# Patient Record
Sex: Female | Born: 1970 | Hispanic: Yes | Marital: Married | Smoking: Never smoker
Health system: Southern US, Community
[De-identification: ages and names within clinical notes are randomized; demographics above are authoritative.]

---

## 2017-01-07 LAB — GLUCOSE, POCT (MANUAL RESULT ENTRY): POC Glucose: 86 mg/dl (ref 70–99)

## 2017-10-11 ENCOUNTER — Ambulatory Visit (INDEPENDENT_AMBULATORY_CARE_PROVIDER_SITE_OTHER): Payer: Self-pay

## 2017-10-11 ENCOUNTER — Encounter (HOSPITAL_COMMUNITY): Payer: Self-pay | Admitting: Emergency Medicine

## 2017-10-11 ENCOUNTER — Ambulatory Visit (HOSPITAL_COMMUNITY)
Admission: EM | Admit: 2017-10-11 | Discharge: 2017-10-11 | Disposition: A | Discharge status: Home / Self Care | Payer: Self-pay | Attending: Family Medicine | Admitting: Family Medicine

## 2017-10-11 DIAGNOSIS — R002 Palpitations: Secondary | ICD-10-CM

## 2017-10-11 DIAGNOSIS — R05 Cough: Secondary | ICD-10-CM

## 2017-10-11 DIAGNOSIS — J209 Acute bronchitis, unspecified: Secondary | ICD-10-CM

## 2017-10-11 DIAGNOSIS — R300 Dysuria: Secondary | ICD-10-CM

## 2017-10-11 LAB — POCT URINALYSIS DIP (DEVICE)
BILIRUBIN URINE: NEGATIVE
GLUCOSE, UA: NEGATIVE mg/dL
Ketones, ur: NEGATIVE mg/dL
LEUKOCYTES UA: NEGATIVE
Nitrite: NEGATIVE
Protein, ur: NEGATIVE mg/dL
Specific Gravity, Urine: >=1.03 (ref 1.005–1.030)
UROBILINOGEN UA: 0.2 mg/dL (ref 0.0–1.0)
pH: 6 (ref 5.0–8.0)

## 2017-10-11 MED ORDER — OMEPRAZOLE 20 MG PO CPDR: 20.0000 mg | DELAYED_RELEASE_CAPSULE | Freq: Every day | ORAL | 1 refills | Status: DC

## 2017-10-11 MED ORDER — PREDNISONE 20 MG PO TABS: ORAL_TABLET | ORAL | 0 refills | Status: DC

## 2017-10-11 NOTE — ED Provider Notes (Addendum)
[facility]   [id number] 10/11/17 Arrival Time: 1015   SUBJECTIVE:  Tricia Carney is a 46 y.o. female who presents to the urgent care with complaint of two weeks of constant CP associated w/palpitations, left arm numbness, chills  Also c/o UTI sx   History reviewed. No pertinent past medical history. History reviewed. No pertinent family history. Social History   Socioeconomic History  . Marital status: Married    Spouse name: Not on file  . Number of children: Not on file  . Years of education: Not on file  . Highest education level: Not on file  Social Needs  . Financial resource strain: Not on file  . Food insecurity - worry: Not on file  . Food insecurity - inability: Not on file  . Transportation needs - medical: Not on file  . Transportation needs - non-medical: Not on file  Occupational History  . Not on file  Tobacco Use  . Smoking status: Never Smoker  . Smokeless tobacco: Never Used  Substance and Sexual Activity  . Alcohol use: Not on file  . Drug use: Not on file  . Sexual activity: Not on file  Other Topics Concern  . Not on file  Social History Narrative  . Not on file   No outpatient medications have been marked as taking for the 10/11/17 encounter [facility](Hospital Encounter).   No Known Allergies    ROS: As per HPI, remainder of ROS negative.   OBJECTIVE:   Vitals:   10/11/17 1138  BP: (!) 141/92  Pulse: 85  Resp: 16  Temp: 98.6 F (37 C)  TempSrc: Oral  SpO2: 95%     General appearance: alert; no distress Eyes: PERRL; EOMI; conjunctiva normal HENT: normocephalic; atraumatic; TMs normal, canal normal, external ears normal without trauma; nasal mucosa normal; oral mucosa normal Neck: supple Lungs: clear to auscultation bilaterally Heart: regular rate and rhythm Abdomen: soft, non-tender; bowel sounds normal; no masses or organomegaly; no guarding or rebound tenderness Back: no CVA tenderness Extremities: no  cyanosis or edema; symmetrical with no gross deformities Skin: warm and dry Neurologic:  grossly normal Psychological: alert and cooperative; normal mood and affect      Labs:  Results for orders placed or performed during the hospital encounter of 10/11/17  POCT urinalysis dip (device)  Result Value Ref Range   Glucose, UA NEGATIVE NEGATIVE mg/dL   Bilirubin Urine NEGATIVE NEGATIVE   Ketones, ur NEGATIVE NEGATIVE mg/dL   Specific Gravity, Urine >=1.030 1.005 - 1.030   Hgb urine dipstick SMALL (A) NEGATIVE   pH 6.0 5.0 - 8.0   Protein, ur NEGATIVE NEGATIVE mg/dL   Urobilinogen, UA 0.2 0.0 - 1.0 mg/dL   Nitrite NEGATIVE NEGATIVE   Leukocytes, UA NEGATIVE NEGATIVE    Labs Reviewed  POCT URINALYSIS DIP (DEVICE) - Abnormal; Notable for the following components:      Result Value   Hgb urine dipstick SMALL (*)    All other components within normal limits    EKG shows no acute changes; Q wave in III.    ASSESSMENT & PLAN:  1. Acute bronchitis, unspecified organism     Meds ordered this encounter  Medications  . predniSONE (DELTASONE) 20 MG tablet    Sig: Two daily with food    Dispense:  10 tablet    Refill:  0  . omeprazole (PRILOSEC) 20 MG capsule    Sig: Take 1 capsule (20 mg total) by mouth daily.    Dispense:  14 capsule    Refill:  1    Reviewed expectations re: course of current medical issues. Questions answered. Outlined signs and symptoms indicating need for more acute intervention. Patient verbalized understanding. After Visit Summary given.      Elvina SidleLauenstein, Devynne Sturdivant, MD 10/11/17 1150    Elvina SidleLauenstein, Rylah Fukuda, MD 10/11/17 1223

## 2017-10-11 NOTE — ED Triage Notes (Signed)
PT C/O: constant CP associated w/palpitations, left arm numbness, chills  Also c/o UTI sx  ONSET: 15 days   DENIES: fevers  TAKING MEDS: acetaminophen   A&O x4... NAD... Ambulatory .Marland Kitchen.Marland Kitchen.. 46 y/o son at bedside.

## 2018-05-23 ENCOUNTER — Encounter (HOSPITAL_COMMUNITY): Payer: Self-pay | Admitting: Emergency Medicine

## 2018-05-23 ENCOUNTER — Ambulatory Visit (HOSPITAL_COMMUNITY)
Admission: EM | Admit: 2018-05-23 | Discharge: 2018-05-23 | Disposition: A | Discharge status: Home / Self Care | Payer: Self-pay | Attending: Family Medicine | Admitting: Family Medicine

## 2018-05-23 ENCOUNTER — Other Ambulatory Visit: Payer: Self-pay

## 2018-05-23 DIAGNOSIS — L84 Corns and callosities: Secondary | ICD-10-CM

## 2018-05-23 MED ORDER — CORN CUSHIONS PADS: 1.0000 "application " | MEDICATED_PAD | Freq: Every day | 0 refills | Status: DC

## 2018-05-23 NOTE — Discharge Instructions (Addendum)
Your symptoms may be due to ill-fitting shoes.  Ensure your shoes fit properly and are not too tight.   Avoid tight shoes or shoes with a heel Avoid going barefoot or wearing shoes without socks Corn cushions prescribed.  Apply to corns daily to help alleviate symptoms.   Primary Care Assistance was initiated to help set you up with a PCP in the area Follow up with PCP or [facility] for further evaluation and management.  They may need to refer you to a foot specialist Return or go to the ED if you have any new or worsening symptoms  Sus sntomas pueden deberse a zapatos que no le [address] bien. Asegrese de que sus zapatos le queden bien y que no estn demasiado apretados. Evite zapatos apretados o zapatos con tacn Evite ir descalzo o usar zapatos sin medias [place] de maz prescritos. Aplicar a los callos diariamente para ayudar a [employer] Groupaliviar los sntomas. La Asistencia de atencin primaria se inici para ayudarlo a Ship brokerestablecer un PCP en el rea Haga un seguimiento con PCP o con [facility] para una evaluacin y gestin adicionales. Es posible que necesiten derivarlo a un especialista en pies. Regrese o vaya al servicio de urgencias si tiene algn sntoma nuevo o que Stantonempeora

## 2018-05-23 NOTE — ED Provider Notes (Signed)
[facility]   [id number] 05/23/18 Arrival Time: 1215  CC: TOE PAIN  SUBJECTIVE:  InterpretorHelmut Muster: Alicia [phone]  History from: patient. Tricia Carney is a 47 y.o. female complains of worsening "calluses" for the past month.  Denies a precipitating event or specific injury, does not attribute her symptoms to her shoes.  Localizes the symptoms to the bilateral pinky toes  Describes the pain as constant burning, and throbbing in character.  Has not tried OTC medications or treatment.  Denies aggravating factors. Denies similar symptoms in the past.  Denies fever, chills, erythema, ecchymosis, effusion, weakness, numbness and tingling.      ROS: As per HPI.  History reviewed. No pertinent past medical history. History reviewed. No pertinent surgical history. No Known Allergies No current facility-administered medications on file prior to encounter.    No current outpatient medications on file prior to encounter.   Social History   Socioeconomic History  . Marital status: Married    Spouse name: Not on file  . Number of children: Not on file  . Years of education: Not on file  . Highest education level: Not on file  Occupational History  . Not on file  Social Needs  . Financial resource strain: Not on file  . Food insecurity:    Worry: Not on file    Inability: Not on file  . Transportation needs:    Medical: Not on file    Non-medical: Not on file  Tobacco Use  . Smoking status: Never Smoker  . Smokeless tobacco: Never Used  Substance and Sexual Activity  . Alcohol use: Not on file  . Drug use: Not on file  . Sexual activity: Not on file  Lifestyle  . Physical activity:    Days per week: Not on file    Minutes per session: Not on file  . Stress: Not on file  Relationships  . Social connections:    Talks on phone: Not on file    Gets together: Not on file    Attends religious service: Not on file    Active member of club or organization: Not on file      Attends meetings of clubs or organizations: Not on file    Relationship status: Not on file  . Intimate partner violence:    Fear of current or ex partner: Not on file    Emotionally abused: Not on file    Physically abused: Not on file    Forced sexual activity: Not on file  Other Topics Concern  . Not on file  Social History Narrative  . Not on file   History reviewed. No pertinent family history.  OBJECTIVE:  Vitals:   05/23/18 1236  BP: (!) 146/90  Pulse: 82  TempSrc: Oral  SpO2: 98%    General appearance: AOx3; in no acute distress.  Head: NCAT Lungs: speaking in full sentences without difficulty CV: dorsalis pedis pulses 2+ bilaterally; cap refill <2 secs about the toes Musculoskeletal: Bilateral feet Inspection: Skin warm, dry, clear and intact without obvious erythema, effusion, or ecchymosis. Hyperkeratosis to the lateral aspect of bilateral 5th toes.   Palpation: Nontender to palpation ROM: FROM active and passive Strength: 5/5 dorsiflexion, 5/5 plantar flexion Skin: warm and dry Neurologic: Ambulates without difficulty; Sensation intact about the lower extremities Psychological: alert and cooperative; normal mood and affect  ASSESSMENT & PLAN:  1. Corns of multiple toes    Meds ordered this encounter  Medications  . Foot Care Products (CORN  CUSHIONS) PADS    Sig: 1 application by Does not apply route daily.    Dispense:  9 each    Refill:  0    Order Specific Question:   Supervising Provider    Answer:   Isa Rankin [[id number]]   Your symptoms may be due to ill-fitting shoes.  Ensure your shoes fit properly and are not too tight.   Avoid tight shoes or shoes with a heel Avoid going barefoot or wearing shoes without socks Corn cushions prescribed.  Apply to corns daily to help alleviate symptoms.   Primary Care Assistance was initiated to help set you up with a PCP in the area Follow up with PCP or with [facility] for  further evaluation and management.  They may need to refer you to a foot specialist Return or go to the ED if you have any new or worsening symptoms  Reviewed expectations re: course of current medical issues. Questions answered. Outlined signs and symptoms indicating need for more acute intervention. Patient verbalized understanding. After Visit Summary given.    Rennis Harding, PA-C 05/23/18 1335

## 2018-05-23 NOTE — ED Triage Notes (Signed)
[employer] Interpreter: Tricia Carney [phone]#[id number]  Pt complains of pain in her pinky toes bilaterally x one month.

## 2018-11-15 ENCOUNTER — Encounter (HOSPITAL_COMMUNITY): Payer: Self-pay | Admitting: Emergency Medicine

## 2018-11-15 ENCOUNTER — Ambulatory Visit (HOSPITAL_COMMUNITY)
Admission: EM | Admit: 2018-11-15 | Discharge: 2018-11-15 | Disposition: A | Discharge status: Home / Self Care | Payer: Self-pay | Attending: Family Medicine | Admitting: Family Medicine

## 2018-11-15 ENCOUNTER — Other Ambulatory Visit: Payer: Self-pay

## 2018-11-15 DIAGNOSIS — K047 Periapical abscess without sinus: Secondary | ICD-10-CM

## 2018-11-15 DIAGNOSIS — J069 Acute upper respiratory infection, unspecified: Secondary | ICD-10-CM

## 2018-11-15 MED ORDER — IBUPROFEN 800 MG PO TABS: 800.0000 mg | ORAL_TABLET | Freq: Three times a day (TID) | ORAL | 0 refills | Status: DC

## 2018-11-15 MED ORDER — HYDROCODONE-HOMATROPINE 5-1.5 MG/5ML PO SYRP: 5.0000 mL | ORAL_SOLUTION | Freq: Four times a day (QID) | ORAL | 0 refills | Status: DC | PRN

## 2018-11-15 MED ORDER — AMOXICILLIN 875 MG PO TABS: 875.0000 mg | ORAL_TABLET | Freq: Two times a day (BID) | ORAL | 0 refills | Status: DC

## 2018-11-15 NOTE — ED Triage Notes (Addendum)
Chills, headache, stomach pain and sore throat/itchy throat and back pain and cough.  Onset on Monday of symptoms.

## 2018-11-15 NOTE — ED Provider Notes (Signed)
[facility]    CSN: [id number] Arrival date & time: 11/15/18  1425     History   Chief Complaint Chief Complaint  Patient presents with  . URI    HPI [place] Selenna Hommel is a 48 y.o. female.   Is a 47 year old female who presents to the [facility] urgent care center complaining of flulike symptoms.  Chills, headache, stomach pain and sore throat/itchy throat and back pain and cough.  Onset on Monday of symptoms.  Patient notes she has a bad tooth and is having a dental evaluation on February 5     History reviewed. No pertinent past medical history.  There are no active problems to display for this patient.   History reviewed. No pertinent surgical history.  OB History   No obstetric history on file.      Home Medications    Prior to Admission medications   Medication Sig Start Date End Date Taking? Authorizing Provider  amoxicillin (AMOXIL) 875 MG tablet Take 1 tablet (875 mg total) by mouth 2 (two) times daily. 11/15/18   Elvina Sidle, MD  Foot Care Products (CORN CUSHIONS) PADS 1 application by Does not apply route daily. 05/23/18   Wurst, [place], PA-C  HYDROcodone-homatropine (HYDROMET) 5-1.5 MG/5ML syrup Take 5 mLs by mouth every 6 (six) hours as needed for cough. 11/15/18   Elvina Sidle, MD  ibuprofen (ADVIL,MOTRIN) 800 MG tablet Take 1 tablet (800 mg total) by mouth 3 (three) times daily. 11/15/18   Elvina Sidle, MD    Family History Family History  Problem Relation Age of Onset  . Hypertension Mother     Social History Social History   Tobacco Use  . Smoking status: Never Smoker  . Smokeless tobacco: Never Used  Substance Use Topics  . Alcohol use: Never    Frequency: Never  . Drug use: Never     Allergies   Patient has no known allergies.   Review of Systems Review of Systems   Physical Exam Triage Vital Signs ED Triage Vitals  Enc Vitals Group     BP 11/15/18 1528 (!) 119/54   Pulse Rate 11/15/18 1527 73     Resp 11/15/18 1527 18     Temp 11/15/18 1527 98.5 F (36.9 C)     Temp Source 11/15/18 1527 Temporal     SpO2 11/15/18 1527 97 %     Weight --      Height --      Head Circumference --      Peak Flow --      Pain Score 11/15/18 1522 8     Pain Loc --      Pain Edu? --      Excl. in [place]? --    No data found.  Updated Vital Signs BP (!) 119/54 (BP Location: Right Arm)   Pulse 73   Temp 98.5 F (36.9 C) (Temporal)   Resp 18   SpO2 97%    Physical Exam Vitals signs and nursing note reviewed.  Constitutional:      Appearance: Normal appearance. She is obese.  HENT:     Head: Normocephalic and atraumatic.     Right Ear: Tympanic membrane and external ear normal.     Left Ear: Tympanic membrane and external ear normal.     Nose: Congestion present.     Mouth/Throat:     Mouth: Mucous membranes are moist.     Comments: Very eroded tooth #13 Eyes:  Conjunctiva/sclera: Conjunctivae normal.  Neck:     Musculoskeletal: Normal range of motion and neck supple.  Cardiovascular:     Rate and Rhythm: Normal rate and regular rhythm.     Heart sounds: Normal heart sounds.  Pulmonary:     Effort: Pulmonary effort is normal.     Breath sounds: Normal breath sounds.  Musculoskeletal: Normal range of motion.  Skin:    General: Skin is warm and dry.  Neurological:     General: No focal deficit present.     Mental Status: She is alert.  Psychiatric:        Mood and Affect: Mood normal.      UC Treatments / Results  Labs (all labs ordered are listed, but only abnormal results are displayed) Labs Reviewed - No data to display  EKG None  Radiology No results found.  Procedures Procedures (including critical care time)  Medications Ordered in UC Medications - No data to display  Initial Impression / Assessment and Plan / UC Course  I have reviewed the triage vital signs and the nursing notes.  Pertinent labs & imaging results that  were available during my care of the patient were reviewed by me and considered in my medical decision making (see chart for details).    Final Clinical Impressions(s) / UC Diagnoses   Final diagnoses:  Dental abscess  URI with cough and congestion   Discharge Instructions   None    ED Prescriptions    Medication Sig Dispense Auth. Provider   amoxicillin (AMOXIL) 875 MG tablet Take 1 tablet (875 mg total) by mouth 2 (two) times daily. 20 tablet Elvina Sidle, MD   HYDROcodone-homatropine (HYDROMET) 5-1.5 MG/5ML syrup Take 5 mLs by mouth every 6 (six) hours as needed for cough. 60 mL Elvina Sidle, MD   ibuprofen (ADVIL,MOTRIN) 800 MG tablet Take 1 tablet (800 mg total) by mouth 3 (three) times daily. 21 tablet Elvina Sidle, MD     Controlled Substance Prescriptions [place] Controlled Substance Registry consulted? Not Applicable   Elvina Sidle, MD 11/15/18 1600

## 2018-12-15 IMAGING — DX DG CHEST 2V
2 series · 2 of 2 positions shown · non-contrast
Comparison: None.

CLINICAL DATA: Cough for 2 weeks

EXAM:
CHEST  2 VIEW

[chest pa]
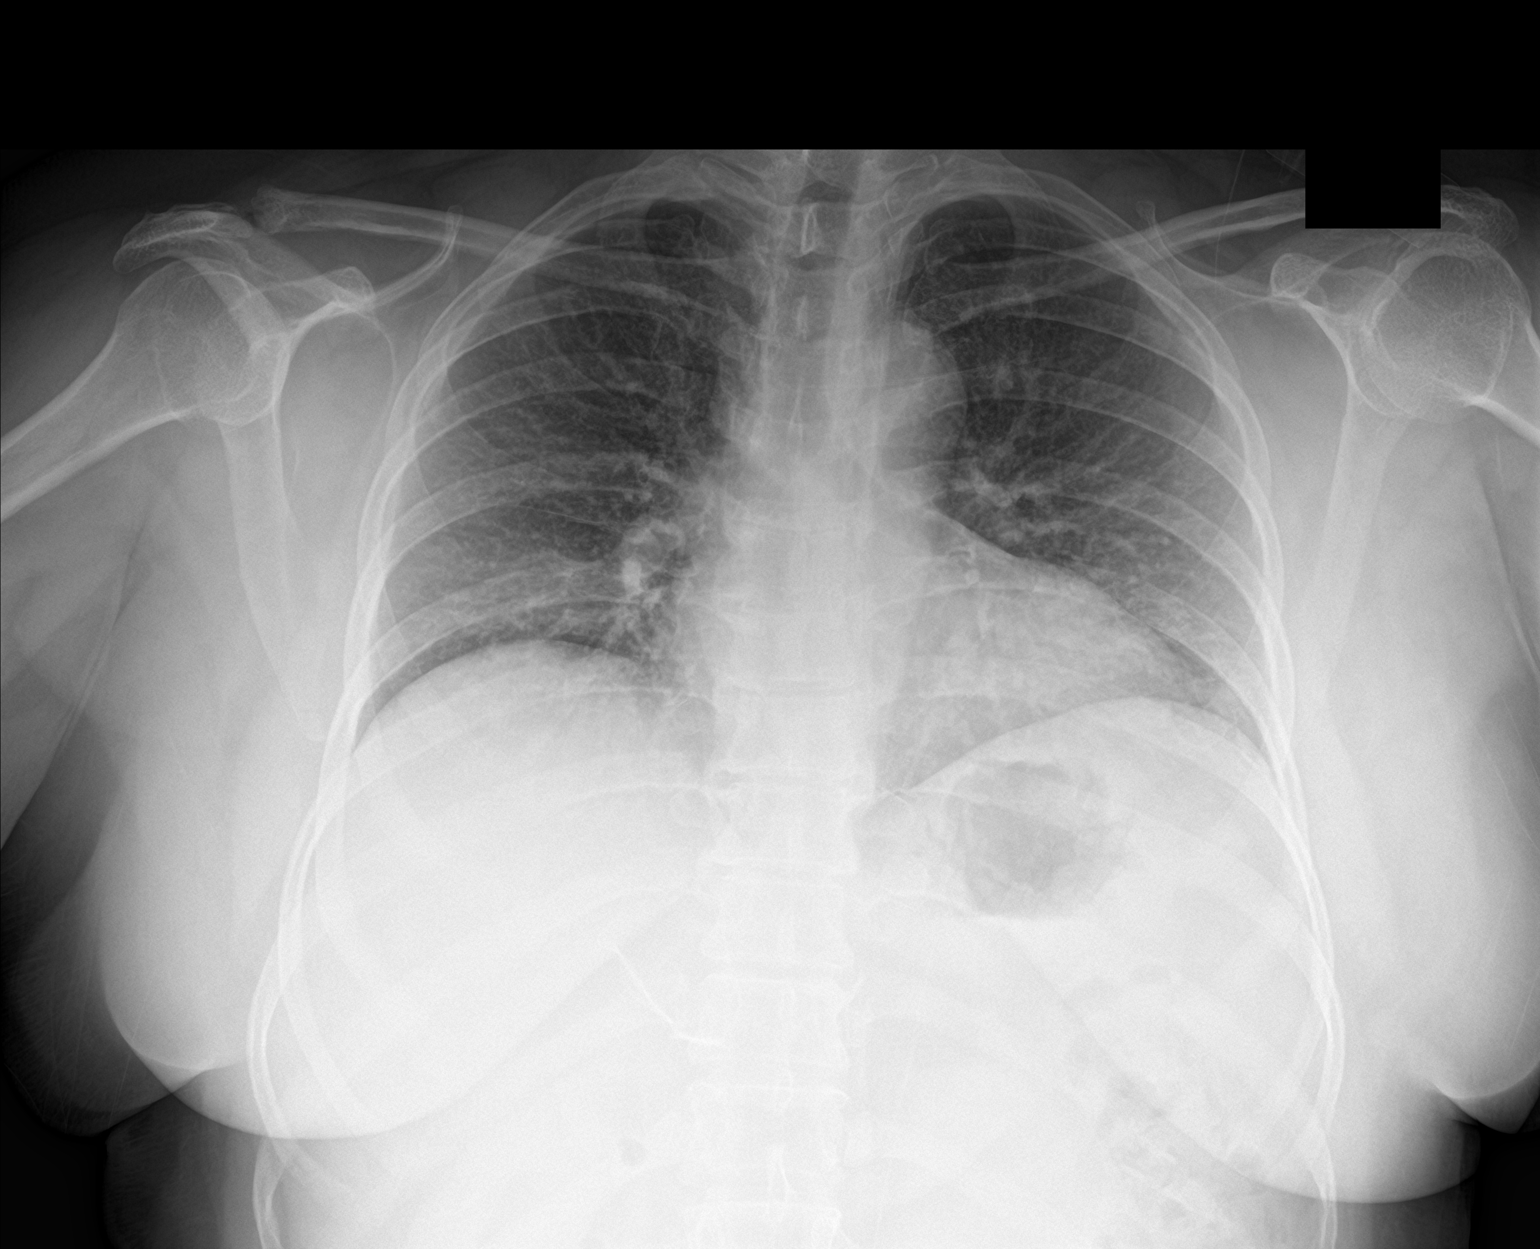

[chest lat]
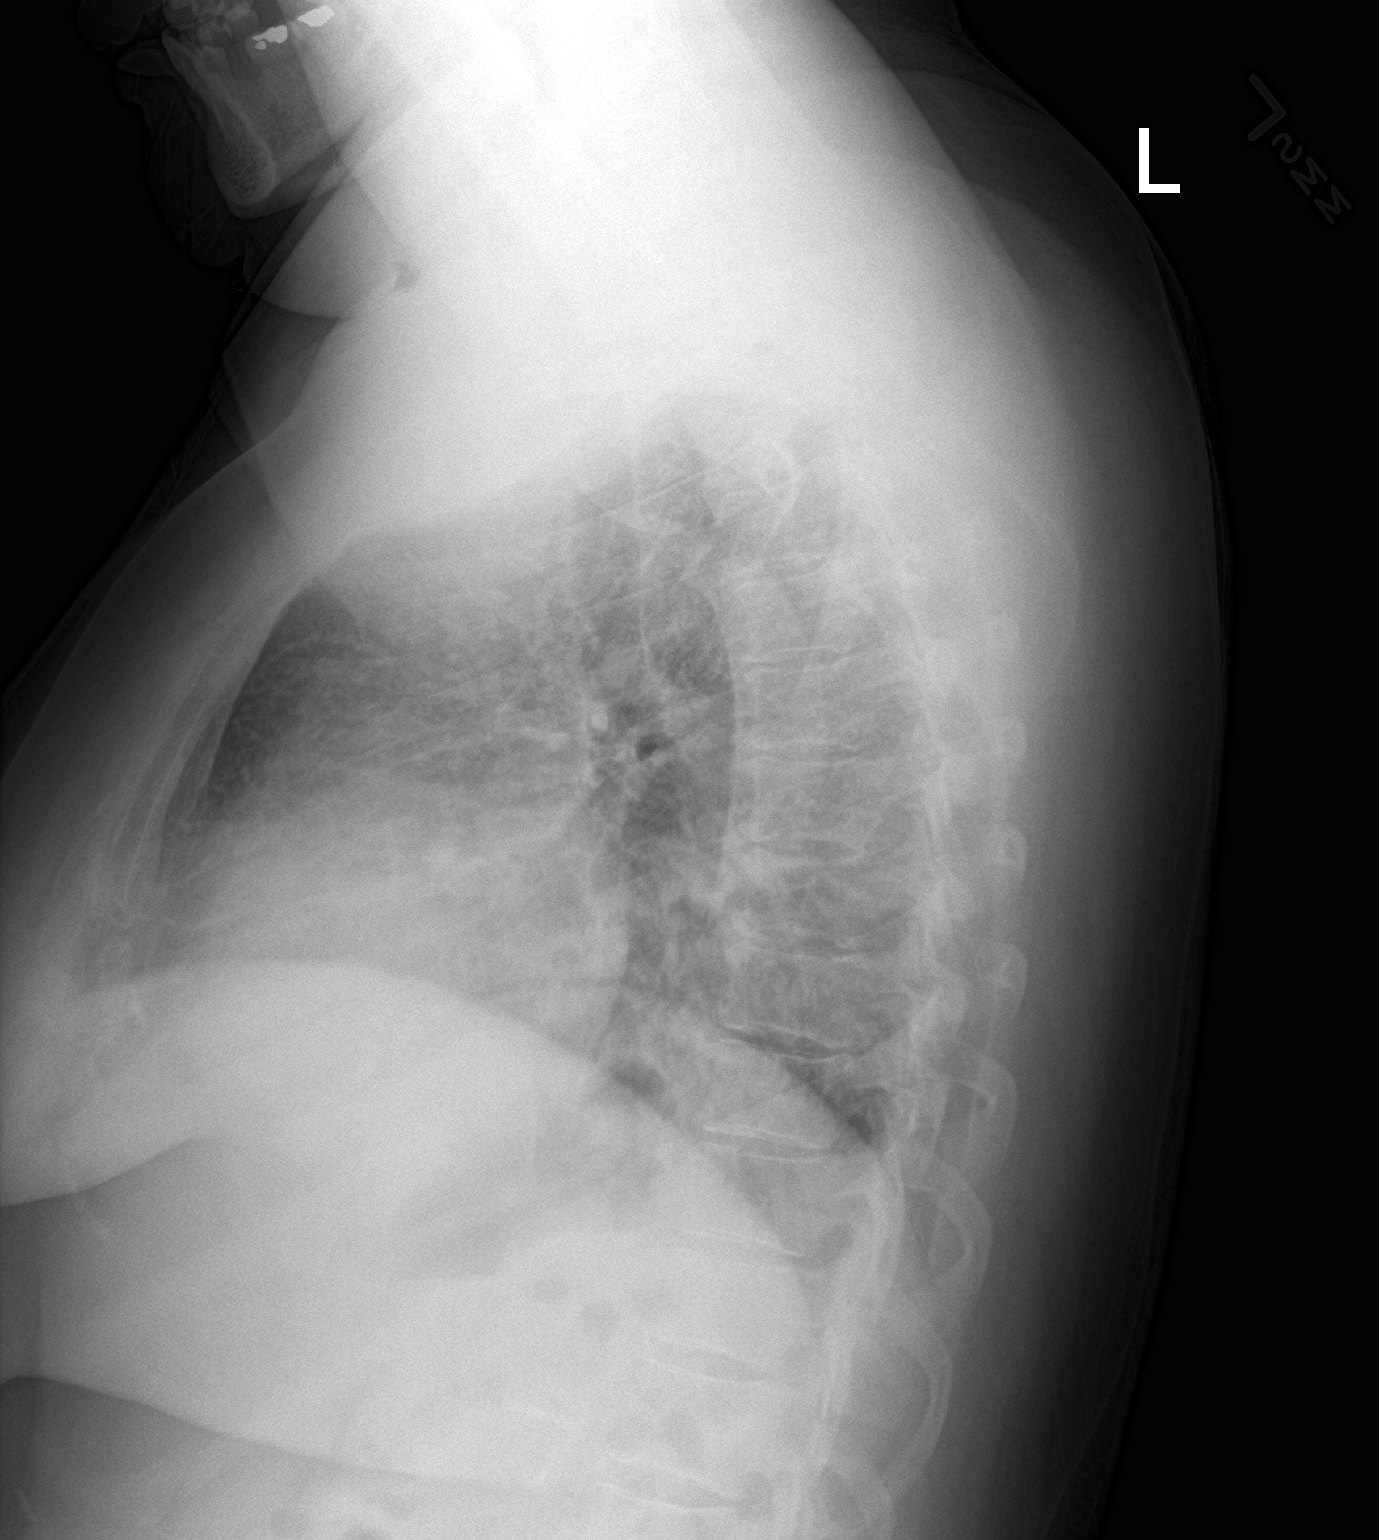

[2 of 2 positions shown; findings below may reference images not displayed]

FINDINGS: There is no focal consolidation. There is bilateral mild diffuse
interstitial thickening which may reflect mild interstitial edema
versus interstitial infection. There is no pleural effusion or
pneumothorax. The heart and mediastinal contours are unremarkable.

The osseous structures are unremarkable.
IMPRESSION: Bilateral mild diffuse interstitial thickening which may reflect
mild interstitial edema versus interstitial infection.

## 2019-07-15 ENCOUNTER — Ambulatory Visit (HOSPITAL_COMMUNITY)
Admission: EM | Admit: 2019-07-15 | Discharge: 2019-07-15 | Disposition: A | Discharge status: Home / Self Care | Payer: Self-pay | Attending: Family Medicine | Admitting: Family Medicine

## 2019-07-15 ENCOUNTER — Encounter (HOSPITAL_COMMUNITY): Payer: Self-pay

## 2019-07-15 ENCOUNTER — Other Ambulatory Visit: Payer: Self-pay

## 2019-07-15 DIAGNOSIS — K219 Gastro-esophageal reflux disease without esophagitis: Secondary | ICD-10-CM | POA: Insufficient documentation

## 2019-07-15 DIAGNOSIS — Z3202 Encounter for pregnancy test, result negative: Secondary | ICD-10-CM

## 2019-07-15 DIAGNOSIS — R350 Frequency of micturition: Secondary | ICD-10-CM | POA: Insufficient documentation

## 2019-07-15 LAB — POCT URINALYSIS DIP (DEVICE)
Bilirubin Urine: NEGATIVE
Glucose, UA: NEGATIVE mg/dL
Ketones, ur: NEGATIVE mg/dL
Leukocytes,Ua: NEGATIVE
Nitrite: NEGATIVE
Protein, ur: NEGATIVE mg/dL
Specific Gravity, Urine: 1.025 (ref 1.005–1.030)
Urobilinogen, UA: 0.2 mg/dL (ref 0.0–1.0)
pH: 7.5 (ref 5.0–8.0)

## 2019-07-15 LAB — GLUCOSE, CAPILLARY: Glucose-Capillary: 189 mg/dL — ABNORMAL HIGH (ref 70–99)

## 2019-07-15 LAB — POCT PREGNANCY, URINE: Preg Test, Ur: NEGATIVE

## 2019-07-15 MED ORDER — ALUM & MAG HYDROXIDE-SIMETH 200-200-20 MG/5ML PO SUSP
ORAL | Status: AC
  Filled 2019-07-15: qty 30

## 2019-07-15 MED ORDER — OMEPRAZOLE 40 MG PO CPDR: 40.0000 mg | DELAYED_RELEASE_CAPSULE | Freq: Every day | ORAL | 0 refills | Status: DC

## 2019-07-15 MED ORDER — LIDOCAINE VISCOUS HCL 2 % MT SOLN
OROMUCOSAL | Status: AC
  Filled 2019-07-15: qty 15

## 2019-07-15 MED ORDER — ALUM & MAG HYDROXIDE-SIMETH 200-200-20 MG/5ML PO SUSP
30.0000 mL | Freq: Once | ORAL | Status: AC
  Administered 2019-07-15: 30 mL via ORAL

## 2019-07-15 MED ORDER — LIDOCAINE VISCOUS HCL 2 % MT SOLN
15.0000 mL | Freq: Once | OROMUCOSAL | Status: AC
  Administered 2019-07-15: 11:00:00 15 mL via ORAL

## 2019-07-15 NOTE — Discharge Instructions (Signed)
Su dolor de Investment banker, operational / boca / abdomen superior es consistente con reflujo gstrico. Comenzaremos con un medicamento que tomar una vez al da para prevenir esto. Consulte la informacin proporcionada sobre esto para Civil engineer, contracting otros cambios que puede intentar ayudar con estos sntomas. su orina no est claramente infectada, por lo que no he comenzado a tomar antibiticos. He enviado su orina para que se cultive para confirmar esto. su ECG mirando su corazn, se vea bien hoy. su nivel de azcar en sangre est elevado, lo que puede provocar una frecuencia urinaria. esto deber volver a comprobarse, por lo que es muy importante que realice un seguimiento con un proveedor de Midwife.  Your throat/mouth/upper abdomine pain is consistent with gastric reflux.  we will start a medication you will take once a day to prevent this.  see information provided about this for other changes you may try to help with these symptoms. your urine is not obviously infected so i have not started antibotics.  i have sent your urine to be cultured to confirm this.  your EKG looking at your heart, looked well today. your blood sugar is elevated, which can lead to urinary frequency. this will need to be rechecked so it is very important that you follow up with a primary care provider.

## 2019-07-15 NOTE — ED Provider Notes (Signed)
[facility]    CSN: [id number] Arrival date & time: 07/15/19  3810      History   Chief Complaint Chief Complaint  Patient presents with  . Abdominal Pain    HPI Tricia Carney is a 48 y.o. female.   Tricia Carney presents with complaints of epigastric pain which causes burning to chest and mouth. Worse at night, can wake her even at night. Worse after eating. Started 8 days ago. Hasn't taken any medications for symptoms. Also with pain with urination as well as frequency. Some low back pain. No fevers. Tried OTC AZO which hasn't helped. Doesn't have a PCP. No known medical history. No nausea or vomiting, no diaphoresis, arm or jaw pain.   Spanish video interpreter used to collect history and physical exam.     ROS per HPI, negative if not otherwise mentioned.      History reviewed. No pertinent past medical history.  There are no active problems to display for this patient.   History reviewed. No pertinent surgical history.  OB History   No obstetric history on file.      Home Medications    Prior to Admission medications   Medication Sig Start Date End Date Taking? Authorizing Provider  Foot Care Products (CORN CUSHIONS) PADS 1 application by Does not apply route daily. 05/23/18   Wurst, [place], PA-C  omeprazole (PRILOSEC) 40 MG capsule Take 1 capsule (40 mg total) by mouth daily. 07/15/19   Zigmund Gottron, NP    Family History Family History  Problem Relation Age of Onset  . Hypertension Mother     Social History Social History   Tobacco Use  . Smoking status: Never Smoker  . Smokeless tobacco: Never Used  Substance Use Topics  . Alcohol use: Never    Frequency: Never  . Drug use: Never     Allergies   Patient has no known allergies.   Review of Systems Review of Systems   Physical Exam Triage Vital Signs ED Triage Vitals  Enc Vitals Group     BP 07/15/19 1026 (!) 141/72     Pulse Rate 07/15/19 1026 67      Resp 07/15/19 1026 16     Temp 07/15/19 1026 98 F (36.7 C)     Temp Source 07/15/19 1026 Temporal     SpO2 07/15/19 1026 100 %     Weight --      Height --      Head Circumference --      Peak Flow --      Pain Score 07/15/19 1031 7     Pain Loc --      Pain Edu? --      Excl. in [place]? --    No data found.  Updated Vital Signs BP (!) 141/72 (BP Location: Right Arm)   Pulse 67   Temp 98 F (36.7 C) (Temporal)   Resp 16   SpO2 100%    Physical Exam Constitutional:      General: She is not in acute distress.    Appearance: She is well-developed.  Cardiovascular:     Rate and Rhythm: Normal rate.  Pulmonary:     Effort: Pulmonary effort is normal.  Abdominal:     Tenderness: There is abdominal tenderness in the epigastric area.  Skin:    General: Skin is warm and dry.  Neurological:     Mental Status: She is alert and oriented to person, place, and time.  EKG:  NSR rate 78 . Previous EKG was available for review. No stemi, noted a few minimally inverted stwaves such as in v6. Does not appear acute.   Reviewed with supervising physician dr. Tracie Harrier.   UC Treatments / Results  Labs (all labs ordered are listed, but only abnormal results are displayed) Labs Reviewed  GLUCOSE, CAPILLARY - Abnormal; Notable for the following components:      Result Value   Glucose-Capillary 189 (*)    All other components within normal limits  POCT URINALYSIS DIP (DEVICE) - Abnormal; Notable for the following components:   Hgb urine dipstick TRACE (*)    All other components within normal limits  URINE CULTURE  POC URINE PREG, ED  CBG MONITORING, ED  POCT PREGNANCY, URINE    EKG   Radiology No results found.  Procedures Procedures (including critical care time)  Medications Ordered in UC Medications  alum & mag hydroxide-simeth (MAALOX/MYLANTA) 200-200-20 MG/5ML suspension 30 mL (30 mLs Oral Given 07/15/19 1101)    And  lidocaine (XYLOCAINE) 2 % viscous mouth  solution 15 mL (15 mLs Oral Given 07/15/19 1101)  alum & mag hydroxide-simeth (MAALOX/MYLANTA) 200-200-20 MG/5ML suspension (has no administration in time range)  lidocaine (XYLOCAINE) 2 % viscous mouth solution (has no administration in time range)    Initial Impression / Assessment and Plan / UC Course  I have reviewed the triage vital signs and the nursing notes.  Pertinent labs & imaging results that were available during my care of the patient were reviewed by me and considered in my medical decision making (see chart for details).     Urine with trace hgb, culture obtained and pending. Non fasting blood sugar is elevated, which is concerning in presence of urinary frequency. No glucose to urine, or ketones. Emphasized follow up with PCP for recheck and establish care. ekg reassuring. Omeprazole provided after gi cocktail provided here today. Patient verbalized understanding and agreeable to plan.   Final Clinical Impressions(s) / UC Diagnoses   Final diagnoses:  Gastroesophageal reflux disease, esophagitis presence not specified  Urinary frequency     Discharge Instructions     Su dolor de garganta / boca / abdomen superior es consistente con reflujo gstrico. Comenzaremos con un medicamento que tomar una vez al da para prevenir esto. Consulte la informacin proporcionada sobre esto para Solicitor otros cambios que puede intentar ayudar con estos sntomas. su orina no est claramente infectada, por lo que no he comenzado a tomar antibiticos. He enviado su orina para que se cultive para confirmar esto. su ECG mirando su corazn, se vea bien hoy. su nivel de azcar en sangre est elevado, lo que puede provocar una frecuencia urinaria. esto deber volver a comprobarse, por lo que es muy importante que realice un seguimiento con un proveedor de Marine scientist.  Your throat/mouth/upper abdomine pain is consistent with gastric reflux.  we will start a medication you will take once  a day to prevent this.  see information provided about this for other changes you may try to help with these symptoms. your urine is not obviously infected so i have not started antibotics.  i have sent your urine to be cultured to confirm this.  your EKG looking at your heart, looked well today. your blood sugar is elevated, which can lead to urinary frequency. this will need to be rechecked so it is very important that you follow up with a primary care provider.        ED Prescriptions  Medication Sig Dispense Auth. Provider   omeprazole (PRILOSEC) 40 MG capsule Take 1 capsule (40 mg total) by mouth daily. 30 capsule Georgetta HaberBurky, Natalie B, NP     PDMP not reviewed this encounter.   Georgetta HaberBurky, Natalie B, NP 07/15/19 [phone]

## 2019-07-15 NOTE — ED Triage Notes (Signed)
Patient presents to Urgent Care with complaints of lower abdominal pain, lower back pain, and pain with urination since the past few days, progressively worse. Patient reports she feels like her pain starts in her mouth and stomach when she eats spicy food, states it is difficult to sleep and burns very badly when she urinates. Spanish interpreter utilized for triage.

## 2019-07-16 LAB — URINE CULTURE: Culture: NO GROWTH

## 2022-02-04 ENCOUNTER — Encounter (HOSPITAL_COMMUNITY): Payer: Self-pay | Admitting: Emergency Medicine

## 2022-02-04 ENCOUNTER — Ambulatory Visit (HOSPITAL_COMMUNITY)
Admission: EM | Admit: 2022-02-04 | Discharge: 2022-02-04 | Disposition: A | Discharge status: Home / Self Care | Payer: Self-pay | Attending: Family Medicine | Admitting: Family Medicine

## 2022-02-04 DIAGNOSIS — L84 Corns and callosities: Secondary | ICD-10-CM

## 2022-02-04 DIAGNOSIS — B353 Tinea pedis: Secondary | ICD-10-CM

## 2022-02-04 MED ORDER — TERBINAFINE HCL 1 % EX CREA: 1.0000 "application " | TOPICAL_CREAM | Freq: Two times a day (BID) | CUTANEOUS | 0 refills | Status: DC

## 2022-02-04 NOTE — ED Provider Notes (Signed)
?[facility] ? ? ? ?CSN: [id number] ?Arrival date & time: 02/04/22  0930 ? ? ?  ? ?History   ?Chief Complaint ?Chief Complaint  ?Patient presents with  ? Foot Problem  ? ? ?HPI ?Tricia Carney is a 51 y.o. female.  ? ?Patient is here for feet problem.  This has been going on x 5 years.  She does not have a pcp.  ?She has itching at her feet.  When she scratches it hurts and bleeds.  The color is darker as well.  ?She has been topical liquid for callouses.  ?She is also using pcn ointment without help.  ? ?History reviewed. No pertinent past medical history. ? ?There are no problems to display for this patient. ? ? ?History reviewed. No pertinent surgical history. ? ?OB History   ?No obstetric history on file. ?  ? ? ? ?Home Medications   ? ?Prior to Admission medications   ?Medication Sig Start Date End Date Taking? Authorizing Provider  ?Foot Care Products (CORN CUSHIONS) PADS 1 application by Does not apply route daily. 05/23/18   Wurst, [place], PA-C  ?omeprazole (PRILOSEC) 40 MG capsule Take 1 capsule (40 mg total) by mouth daily. 07/15/19   Zigmund Gottron, NP  ? ? ?Family History ?Family History  ?Problem Relation Age of Onset  ? Hypertension Mother   ? ? ?Social History ?Social History  ? ?Tobacco Use  ? Smoking status: Never  ? Smokeless tobacco: Never  ?Substance Use Topics  ? Alcohol use: Never  ? Drug use: Never  ? ? ? ?Allergies   ?Patient has no known allergies. ? ? ?Review of Systems ?Review of Systems  ?Constitutional: Negative.   ?HENT: Negative.    ?Respiratory: Negative.    ?Cardiovascular: Negative.   ?Gastrointestinal: Negative.   ?Endocrine: Negative.   ?Genitourinary: Negative.   ?Skin:  Positive for rash.  ? ? ?Physical Exam ?Triage Vital Signs ?ED Triage Vitals [02/04/22 1000]  ?Enc Vitals Group  ?   BP 121/78  ?   Pulse Rate 74  ?   Resp 18  ?   Temp 98.4 ?F (36.9 ?C)  ?   Temp Source Oral  ?   SpO2 96 %  ?   Weight   ?   Height   ?   Head Circumference   ?   Peak Flow   ?    Pain Score   ?   Pain Loc   ?   Pain Edu?   ?   Excl. in [place]?   ? ?No data found. ? ?Updated Vital Signs ?BP 121/78 (BP Location: Left Arm)   Pulse 74   Temp 98.4 ?F (36.9 ?C) (Oral)   Resp 18   SpO2 96%  ? ?Visual Acuity ?Right Eye Distance:   ?Left Eye Distance:   ?Bilateral Distance:   ? ?Right Eye Near:   ?Left Eye Near:    ?Bilateral Near:    ? ?Physical Exam ?Skin: ?   Comments: There is a darker, hyerpigmentation to the tops of the feet bilaterally;  there is callus formation at the right medial bid toe.  There is dry, flakiness to the feet and toes bilaterally  ?Neurological:  ?   General: No focal deficit present.  ?   Mental Status: She is alert.  ?Psychiatric:     ?   Mood and Affect: Mood normal.  ? ? ? ?UC Treatments / Results  ?Labs ?(all labs ordered are listed,  but only abnormal results are displayed) ?Labs Reviewed - No data to display ? ?EKG ? ? ?Radiology ?No results found. ? ?Procedures ?Procedures (including critical care time) ? ?Medications Ordered in UC ?Medications - No data to display ? ?Initial Impression / Assessment and Plan / UC Course  ?I have reviewed the triage vital signs and the nursing notes. ? ?Pertinent labs & imaging results that were available during my care of the patient were reviewed by me and considered in my medical decision making (see chart for details). ? ?  ?Final Clinical Impressions(s) / UC Diagnoses  ? ?Final diagnoses:  ?Tinea pedis of both feet  ?Callus  ? ? ? ?Discharge Instructions   ? ?  ?Te vieron hoy por problemas en los pies. He enviado un medicamento para ayudar con una infecci?n por hongos que est? causando enrojecimiento, picaz?n y Social research officer, government. Mantenga sus pies lo m?s secos posible y qu?tese los zapatos/calcetines mojados cuando pueda. ?Si sus problemas contin?an, debe ver a un especialista en pies para recibir m?s atenci?n/discusi?n. Tambi?n pueden ayudarte con tus callos. ? ?You were seen today for feet issues.  I have sent out a medication to help with  a fungal infection that is causing redness, itching and pain.   Please keep your feet as dry as possible and take off wet shoes/socks when you can.  ?If your issues continue, you should see a foot specialist for further care/discussion.  They can also help you with your calluses.  ? ? ? ? ?ED Prescriptions   ? ? Medication Sig Dispense Auth. Provider  ? terbinafine (LAMISIL AT) 1 % cream Apply 1 application. topically 2 (two) times daily. 30 g Rondel Oh, MD  ? ?  ? ?PDMP not reviewed this encounter. ?  ?Rondel Oh, MD ?02/04/22 1030 ? ?

## 2022-02-04 NOTE — Discharge Instructions (Addendum)
Te vieron hoy por problemas en los pies. He enviado un medicamento para ayudar con una infecci?n por hongos que est? causando enrojecimiento, picaz?n y Engineer, mining. Mantenga sus pies lo m?s secos posible y qu?tese los zapatos/calcetines mojados cuando pueda. ?Si sus problemas contin?an, debe ver a un especialista en pies para recibir m?s atenci?n/discusi?n. Tambi?n pueden ayudarte con tus callos. ? ?You were seen today for feet issues.  I have sent out a medication to help with a fungal infection that is causing redness, itching and pain.   Please keep your feet as dry as possible and take off wet shoes/socks when you can.  ?If your issues continue, you should see a foot specialist for further care/discussion.  They can also help you with your calluses.  ? ?

## 2022-02-04 NOTE — ED Triage Notes (Signed)
Pt reports that has itching in bilat feet and when scratches it hurts and thinks might have infection. Pt reports this has been ongoing for 5 years. Reports skin is thick on lateral sides of 5th toes, thinks is calluses.  ?Pt reports that worked in [employer] for a long time and would get wet a lot causing skin to darken in color.  ?Pt denies having a PCP ?

## 2022-02-09 ENCOUNTER — Ambulatory Visit (HOSPITAL_COMMUNITY)
Admission: EM | Admit: 2022-02-09 | Discharge: 2022-02-09 | Disposition: A | Discharge status: Home / Self Care | Payer: Self-pay | Attending: Nurse Practitioner | Admitting: Nurse Practitioner

## 2022-02-09 ENCOUNTER — Encounter (HOSPITAL_COMMUNITY): Payer: Self-pay

## 2022-02-09 DIAGNOSIS — R739 Hyperglycemia, unspecified: Secondary | ICD-10-CM | POA: Insufficient documentation

## 2022-02-09 DIAGNOSIS — E1165 Type 2 diabetes mellitus with hyperglycemia: Secondary | ICD-10-CM

## 2022-02-09 DIAGNOSIS — R300 Dysuria: Secondary | ICD-10-CM | POA: Insufficient documentation

## 2022-02-09 LAB — BASIC METABOLIC PANEL
Anion gap: 11 (ref 5–15)
BUN: 13 mg/dL (ref 6–20)
CO2: 23 mmol/L (ref 22–32)
Calcium: 9 mg/dL (ref 8.9–10.3)
Chloride: 101 mmol/L (ref 98–111)
Creatinine, Ser: 0.52 mg/dL (ref 0.44–1.00)
GFR, Estimated: >60 mL/min (ref >60)
Glucose, Bld: 270 mg/dL — ABNORMAL HIGH (ref 70–99)
Potassium: 3.5 mmol/L (ref 3.5–5.1)
Sodium: 135 mmol/L (ref 135–145)

## 2022-02-09 LAB — POCT URINALYSIS DIPSTICK, ED / UC
Glucose, UA: 100 mg/dL — AB
Hgb urine dipstick: NEGATIVE
Ketones, ur: 15 mg/dL — AB
Leukocytes,Ua: NEGATIVE
Nitrite: POSITIVE — AB
Protein, ur: >=300 mg/dL — AB
Specific Gravity, Urine: 1.025 (ref 1.005–1.030)
Urobilinogen, UA: 1 mg/dL (ref 0.0–1.0)
pH: 6 (ref 5.0–8.0)

## 2022-02-09 LAB — CBG MONITORING, ED: Glucose-Capillary: 258 mg/dL — ABNORMAL HIGH (ref 70–99)

## 2022-02-09 LAB — HEMOGLOBIN A1C
Hgb A1c MFr Bld: 11 % — ABNORMAL HIGH (ref 4.8–5.6)
Mean Plasma Glucose: 269 mg/dL

## 2022-02-09 MED ORDER — METFORMIN HCL 500 MG PO TABS: ORAL_TABLET | ORAL | 0 refills | Status: DC

## 2022-02-09 MED ORDER — CEPHALEXIN 500 MG PO CAPS: 500.0000 mg | ORAL_CAPSULE | Freq: Two times a day (BID) | ORAL | 0 refills | Status: AC

## 2022-02-09 NOTE — ED Triage Notes (Signed)
2 day h/o dysuria, abdominal and low back pain. Pt also c/o loss of taste. ?Denies urinary frequency, hematuria and foul urine odor. Has been taking amoxicillin w/o relief. ?

## 2022-02-09 NOTE — ED Provider Notes (Signed)
?[facility] ? ? ? ?CSN: [id number] ?Arrival date & time: 02/09/22  7902 ? ? ?  ? ?History   ?Chief Complaint ?Chief Complaint  ?Patient presents with  ? Dysuria  ? ? ?HPI ?Tricia Carney is a 51 y.o. female.  ? ?Patient presents today with son and medical interpreter.  She reports lower abdominal pain, burning with urination, blood in her urine on and off since " forever."  She also reports voiding smaller amounts, bilateral back pain.  She denies fevers, nausea/vomiting.  She has taken amoxicillin for symptoms.  She denies vaginal discharge.  She has been eating and drinking normally, denies any recent weight loss.  Denies any history of hypertension or diabetes and is not taking any medications daily currently. ? ? ? ? ?History reviewed. No pertinent past medical history. ? ?There are no problems to display for this patient. ? ? ?History reviewed. No pertinent surgical history. ? ?OB History   ?No obstetric history on file. ?  ? ? ? ?Home Medications   ? ?Prior to Admission medications   ?Medication Sig Start Date End Date Taking? Authorizing Provider  ?cephALEXin (KEFLEX) 500 MG capsule Take 1 capsule (500 mg total) by mouth 2 (two) times daily for 5 days. 02/09/22 02/14/22 Yes Valentino Nose, NP  ?metFORMIN (GLUCOPHAGE) 500 MG tablet Take 1 tablet (500 mg total) by mouth daily with breakfast for 7 days, THEN 1 tablet (500 mg total) 2 (two) times daily with a meal for 23 days. Start taking once daily for 1 week then increase to twice daily. 02/09/22 03/11/22 Yes Valentino Nose, NP  ?terbinafine (LAMISIL AT) 1 % cream Apply 1 application. topically 2 (two) times daily. 02/04/22   Jannifer Franklin, MD  ? ? ?Family History ?Family History  ?Problem Relation Age of Onset  ? Hypertension Mother   ? ? ?Social History ?Social History  ? ?Tobacco Use  ? Smoking status: Never  ? Smokeless tobacco: Never  ?Substance Use Topics  ? Alcohol use: Never  ? Drug use: Never  ? ? ? ?Allergies   ?Patient has no  known allergies. ? ? ?Review of Systems ?Review of Systems ?Per HPI ? ?Physical Exam ?Triage Vital Signs ?ED Triage Vitals  ?Enc Vitals Group  ?   BP 02/09/22 1031 125/64  ?   Pulse Rate 02/09/22 1031 88  ?   Resp 02/09/22 1031 18  ?   Temp 02/09/22 1031 98.5 ?F (36.9 ?C)  ?   Temp Source 02/09/22 1031 Oral  ?   SpO2 02/09/22 1031 96 %  ?   Weight --   ?   Height --   ?   Head Circumference --   ?   Peak Flow --   ?   Pain Score 02/09/22 1039 10  ?   Pain Loc --   ?   Pain Edu? --   ?   Excl. in [place]? --   ? ?No data found. ? ?Updated Vital Signs ?BP 125/64 (BP Location: Left Arm)   Pulse 88   Temp 98.5 ?F (36.9 ?C) (Oral)   Resp 18   SpO2 96%  ? ?Visual Acuity ?Right Eye Distance:   ?Left Eye Distance:   ?Bilateral Distance:   ? ?Right Eye Near:   ?Left Eye Near:    ?Bilateral Near:    ? ?Physical Exam ?Vitals and nursing note reviewed.  ?Constitutional:   ?   General: She is not in acute distress. ?  Appearance: Normal appearance. She is not ill-appearing, toxic-appearing or diaphoretic.  ?Pulmonary:  ?   Effort: Pulmonary effort is normal. No respiratory distress.  ?   Breath sounds: Normal breath sounds. No wheezing, rhonchi or rales.  ?Abdominal:  ?   General: Abdomen is flat. Bowel sounds are normal. There is no distension.  ?   Palpations: Abdomen is soft. There is no mass.  ?   Tenderness: There is no abdominal tenderness. There is no right CVA tenderness, left CVA tenderness or guarding.  ?Skin: ?   General: Skin is warm and dry.  ?   Capillary Refill: Capillary refill takes less than 2 seconds.  ?   Coloration: Skin is not jaundiced or pale.  ?   Findings: No erythema.  ?Neurological:  ?   Mental Status: She is alert and oriented to person, place, and time.  ?   Motor: No weakness.  ?   Gait: Gait normal.  ?Psychiatric:     ?   Behavior: Behavior is cooperative.  ? ? ? ?UC Treatments / Results  ?Labs ?(all labs ordered are listed, but only abnormal results are displayed) ?Labs Reviewed  ?HEMOGLOBIN A1C  - Abnormal; Notable for the following components:  ?    Result Value  ? Hgb A1c MFr Bld 11.0 (*)   ? All other components within normal limits  ?BASIC METABOLIC PANEL - Abnormal; Notable for the following components:  ? Glucose, Bld 270 (*)   ? All other components within normal limits  ?POCT URINALYSIS DIPSTICK, ED / UC - Abnormal; Notable for the following components:  ? Glucose, UA 100 (*)   ? Bilirubin Urine MODERATE (*)   ? Ketones, ur 15 (*)   ? Protein, ur >=300 (*)   ? Nitrite POSITIVE (*)   ? All other components within normal limits  ?CBG MONITORING, ED - Abnormal; Notable for the following components:  ? Glucose-Capillary 258 (*)   ? All other components within normal limits  ?URINE CULTURE  ? ? ?EKG ? ? ?Radiology ?No results found. ? ?Procedures ?Procedures (including critical care time) ? ?Medications Ordered in UC ?Medications - No data to display ? ?Initial Impression / Assessment and Plan / UC Course  ?I have reviewed the triage vital signs and the nursing notes. ? ?Pertinent labs & imaging results that were available during my care of the patient were reviewed by me and considered in my medical decision making (see chart for details). ? ?  ?UA today positive nitrites, positive for glucose, as well as more than 300 protein.  Given concern for diabetes, blood sugar checked which was over 250.  I discussed with the patient and her son that she currently likely has a urinary tract infection.  We will treat this with Keflex 500 mg twice daily for 5 days.  Additionally, she likely has diabetes given her fasting blood sugar.  We will check A1c today along with her electrolytes and kidney function.  If her kidney function is normal, start metformin 500 mg daily for 7 days, then increase to 500 mg twice daily.  Discussed cutting back on carbohydrates, sugar in her diet.  Encourage patient to establish with primary care-we will give assistance from urgent care.  The patient was given the opportunity to ask  questions.  All questions answered to their satisfaction.  The patient is in agreement to this plan.  ? ?Final Clinical Impressions(s) / UC Diagnoses  ? ?Final diagnoses:  ?Dysuria  ?Hyperglycemia  ? ? ? ?  Discharge Instructions   ? ?  ?- The urine test today shows protein, glucose, and signs of infection ?- Please start the antibiotic (Keflex) for the infection in your bladder ?- The blood sugar today was more than 200; this likely means that you have diabetes ?- We will plan to start of Metformin 500 mg daily to help with diabetes as long as your kidney function comes back normal ?- We will help arrange a primary care provider to discuss the protein in your urine and more blood testing for diabetes and hypertension ? ? ? ?ED Prescriptions   ? ? Medication Sig Dispense Auth. Provider  ? cephALEXin (KEFLEX) 500 MG capsule Take 1 capsule (500 mg total) by mouth 2 (two) times daily for 5 days. 10 capsule Cathlean MarseillesMartinez, Kayse Puccini A, NP  ? metFORMIN (GLUCOPHAGE) 500 MG tablet Take 1 tablet (500 mg total) by mouth daily with breakfast for 7 days, THEN 1 tablet (500 mg total) 2 (two) times daily with a meal for 23 days. Start taking once daily for 1 week then increase to twice daily. 53 tablet Valentino NoseMartinez, Micala Saltsman A, NP  ? ?  ? ?PDMP not reviewed this encounter. ?  ?Valentino NoseMartinez, Symphonie Schneiderman A, NP ?02/09/22 1429 ? ?

## 2022-02-09 NOTE — Discharge Instructions (Addendum)
-   The urine test today shows protein, glucose, and signs of infection ?- Please start the antibiotic (Keflex) for the infection in your bladder ?- The blood sugar today was more than 200; this likely means that you have diabetes ?- We will plan to start of Metformin 500 mg daily to help with diabetes as long as your kidney function comes back normal ?- We will help arrange a primary care provider to discuss the protein in your urine and more blood testing for diabetes and hypertension ?

## 2022-02-10 LAB — URINE CULTURE: Culture: <10000 — AB

## 2022-03-07 ENCOUNTER — Ambulatory Visit (INDEPENDENT_AMBULATORY_CARE_PROVIDER_SITE_OTHER): Payer: Self-pay | Admitting: Podiatry

## 2022-03-07 ENCOUNTER — Encounter: Payer: Self-pay | Admitting: Podiatry

## 2022-03-07 DIAGNOSIS — L309 Dermatitis, unspecified: Secondary | ICD-10-CM

## 2022-03-07 DIAGNOSIS — L439 Lichen planus, unspecified: Secondary | ICD-10-CM

## 2022-03-07 MED ORDER — BETAMETHASONE DIPROPIONATE 0.05 % EX CREA: TOPICAL_CREAM | Freq: Two times a day (BID) | CUTANEOUS | 1 refills | Status: DC

## 2022-03-08 ENCOUNTER — Encounter: Payer: Self-pay | Admitting: Podiatry

## 2022-03-08 NOTE — Progress Notes (Signed)
  Subjective:  Patient ID: Tricia Carney, female    DOB: October 02, 1971,  MRN: [id number]  Chief Complaint  Patient presents with   Diabetes       NP itching , dark spots on feet and pain corns on 3 of toes  -she is diabetic- $200 self pay    51 y.o. female presents with the above complaint. History confirmed with patient.  Itchy discoloration on both feet has been worsening over the last few years.  Says they started as fluid-filled bumps.  Also has purple looking lesions on her back that are itchy that began around the same time.  Has not improved with terbinafine cream Objective:  Physical Exam: warm, good capillary refill, no trophic changes or ulcerative lesions, normal DP and PT pulses, normal sensory exam, and dry scaling itchy skin.  Bilateral with right foot worse than left.  Hyperpigmentation noted in both feet Assessment:   1. Dermatitis of foot   2. Lichen planus      Plan:  Patient was evaluated and treated and all questions answered.  I discussed with her that I suspect this is likely dermatitis and possibly lichen planus that is consistent with the lesions on her back.  I recommended corticosteroid treatment and a Diprolene cream prescription was sent to her pharmacy.  Discussed with her that if this does not improve added oral antifungal therapy for any overlying tinea pedis may be helpful as well.  Finally would consider biopsy or referral to dermatology if continues to not improve.  I will see her back as needed.  Return if symptoms worsen or fail to improve.

## 2022-06-08 ENCOUNTER — Other Ambulatory Visit: Payer: Self-pay

## 2022-06-08 ENCOUNTER — Encounter (INDEPENDENT_AMBULATORY_CARE_PROVIDER_SITE_OTHER): Payer: Self-pay | Admitting: Primary Care

## 2022-06-08 ENCOUNTER — Ambulatory Visit (INDEPENDENT_AMBULATORY_CARE_PROVIDER_SITE_OTHER): Payer: Self-pay | Admitting: Primary Care

## 2022-06-08 VITALS — BP 131/84 | HR 89 | Temp 98.2°F | Ht 62.0 in | Wt 186.4 lb

## 2022-06-08 DIAGNOSIS — Z13220 Encounter for screening for lipoid disorders: Secondary | ICD-10-CM

## 2022-06-08 DIAGNOSIS — Z23 Encounter for immunization: Secondary | ICD-10-CM

## 2022-06-08 DIAGNOSIS — I10 Essential (primary) hypertension: Secondary | ICD-10-CM

## 2022-06-08 DIAGNOSIS — E119 Type 2 diabetes mellitus without complications: Secondary | ICD-10-CM

## 2022-06-08 DIAGNOSIS — Z7689 Persons encountering health services in other specified circumstances: Secondary | ICD-10-CM

## 2022-06-08 LAB — POCT GLYCOSYLATED HEMOGLOBIN (HGB A1C): Hemoglobin A1C: 6.6 % — AB (ref 4.0–5.6)

## 2022-06-08 MED ORDER — METFORMIN HCL ER 500 MG PO TB24
500.0000 mg | ORAL_TABLET | Freq: Every day | ORAL | 1 refills | Status: DC
  Filled 2022-06-08: qty 30, 30d supply, fill #0

## 2022-06-08 MED ORDER — LISINOPRIL 5 MG PO TABS
5.0000 mg | ORAL_TABLET | Freq: Every day | ORAL | 1 refills | Status: DC
  Filled 2022-06-08: qty 90, 90d supply, fill #0

## 2022-06-08 MED ORDER — METFORMIN HCL ER 500 MG PO TB24: 500.0000 mg | ORAL_TABLET | Freq: Every day | ORAL | 1 refills | Status: DC

## 2022-06-08 NOTE — Progress Notes (Signed)
New Patient Office Visit  Subjective    Patient ID: Tricia Carney, female    DOB: 1971-02-11  Age: 51 y.o. MRN: [id number]  CC:  Chief Complaint  Patient presents with   New Patient (Initial Visit)    Diabetes     Subjective:  Ms. Tricia Carney is a 51 year old Hispanic female (interpreter Beverley Fiedler [phone]) presents to establish care. DM is in excellent control previously from 11.0 today 6.6.  She does have concerns with when she is working she does not drink enough or able to take breaks to eat the the correct foods and she becomes not feeling as well. She denies polyuria, polyphagia, polydipsia or vision changes.  No Known Allergies  No past medical history on file.  Current Outpatient Medications on File Prior to Visit  Medication Sig Dispense Refill   metFORMIN (GLUCOPHAGE) 500 MG tablet Take 1 tablet (500 mg total) by mouth daily with breakfast for 7 days, THEN 1 tablet (500 mg total) 2 (two) times daily with a meal for 23 days. Start taking once daily for 1 week then increase to twice daily. 53 tablet 0   No current facility-administered medications on file prior to visit.    Comprehensive ROS Pertinent positive and negative noted in HPI   Objective:   Vitals:   06/08/22 0844 06/08/22 0908  BP: (!) 149/98 131/84  Pulse: 89 89  Temp: 98.2 F (36.8 C)   TempSrc: Oral   SpO2: 94%   Weight: 186 lb 6.4 oz (84.6 kg)     Exam General appearance : Awake, alert, not in any distress. Speech Clear. Not toxic looking HEENT: Atraumatic and Normocephalic, pupils equally reactive to light and accomodation Neck: Supple, no JVD. No cervical lymphadenopathy.  Chest: Good air entry bilaterally, no added sounds  CVS: S1 S2 regular, no murmurs.  Abdomen: Bowel sounds present, Non tender and not distended with no gaurding, rigidity or rebound. Extremities: B/L Lower Ext shows no edema, both legs are warm to touch Neurology: Awake alert, and oriented X 3, Non  focal Skin: No Rash   Assessment & Plan  Kurt was seen today for new patient (initial visit).  Diagnoses and all orders for this visit: Encounter to establish care Establish care with PCP  Type 2 diabetes mellitus without complication, without long-term current use of insulin (HCC) -     HgB A1c 6.6 previously 4 months ago A1c of 11 She has done remarkable with warning her A1c to prediabetic status.  She continues to monitor her carbohydrates and exercises daily -     Microalbumin, urine -     metFORMIN (GLUCOPHAGE-XR) 500 MG 24 hr tablet; Take 1 tablet (500 mg total) by mouth daily with breakfast.  Need for Tdap vaccination -     Tdap vaccine greater than or equal to 7yo IM  Need for immunization against influenza -     Flu Vaccine QUAD 46mo+IM (Fluarix, Fluzone & Alfiuria Quad PF)  Lipid screening -     Lipid Panel  Essential hypertension Blood pressure is borderline for diagnoses of hypertension.  Per guidelines ACE or ARB's are recommended for renal protection will prescribe lisinopril.  Information provided for any adverse reactions dry cough any swelling around mouth, tongue  Other orders -     lisinopril (ZESTRIL) 5 MG tablet; Take 1 tablet (5 mg total) by mouth daily.    The patient was given clear instructions to go to ER or return to medical center  if symptoms don't improve, worsen or new problems develop. The patient verbalized understanding. The patient was told to call to get lab results if they haven't heard anything in the next week.   This note has been created with Education officer, environmental. Any transcriptional errors are unintentional.   Grayce Sessions, NP 06/08/2022, 9:20 AM

## 2022-06-09 LAB — LIPID PANEL
Chol/HDL Ratio: 4.1 ratio (ref 0.0–4.4)
Cholesterol, Total: 161 mg/dL (ref 100–199)
HDL: 39 mg/dL — ABNORMAL LOW (ref >39)
LDL Chol Calc (NIH): 104 mg/dL — ABNORMAL HIGH (ref 0–99)
Triglycerides: 97 mg/dL (ref 0–149)
VLDL Cholesterol Cal: 18 mg/dL (ref 5–40)

## 2022-06-09 LAB — MICROALBUMIN, URINE: Microalbumin, Urine: 78.7 ug/mL

## 2022-06-10 ENCOUNTER — Other Ambulatory Visit: Payer: Self-pay

## 2022-06-14 ENCOUNTER — Other Ambulatory Visit (INDEPENDENT_AMBULATORY_CARE_PROVIDER_SITE_OTHER): Payer: Self-pay | Admitting: Primary Care

## 2022-06-14 MED ORDER — PRAVASTATIN SODIUM 20 MG PO TABS
20.0000 mg | ORAL_TABLET | Freq: Every day | ORAL | 1 refills | Status: AC
  Filled 2022-06-14: qty 90, 90d supply, fill #0

## 2022-06-15 ENCOUNTER — Other Ambulatory Visit: Payer: Self-pay

## 2022-06-17 ENCOUNTER — Other Ambulatory Visit: Payer: Self-pay

## 2022-06-17 ENCOUNTER — Telehealth (INDEPENDENT_AMBULATORY_CARE_PROVIDER_SITE_OTHER): Payer: Self-pay

## 2022-06-17 NOTE — Telephone Encounter (Signed)
Call placed to patient with pacific interpreter [phone]) patient aware of normal labs except elevated cholesterol which increases your risk for stroke and heart attack. Cholesterol medication has been sent to pharmacy to take at bedtime. Dietary advise provided. She verbalized understanding. Maryjean Morn, CMA

## 2022-06-17 NOTE — Telephone Encounter (Signed)
-----   Message from Grayce Sessions, NP sent at 06/14/2022 10:14 PM EDT ----- Labs are nor except Your cholesterol is high, Increase risk of heart attack and/or stroke.  To reduce your Cholesterol , Remember - more fruits and vegetables, more fish, and limit red meat and dairy products. More soy, nuts, beans, barley, lentils, oats and plant sterol ester enriched margarine instead of butter. I also encourage eliminating sugar and processed food. New script sent for pravastatin 20 mg take at bedtime . Also, recommended for diabetes.

## 2022-09-09 ENCOUNTER — Ambulatory Visit (INDEPENDENT_AMBULATORY_CARE_PROVIDER_SITE_OTHER): Payer: Self-pay | Admitting: Primary Care

## 2022-09-12 ENCOUNTER — Encounter (INDEPENDENT_AMBULATORY_CARE_PROVIDER_SITE_OTHER): Payer: Self-pay | Admitting: Primary Care

## 2022-09-12 ENCOUNTER — Ambulatory Visit (INDEPENDENT_AMBULATORY_CARE_PROVIDER_SITE_OTHER): Payer: Self-pay | Admitting: Primary Care

## 2022-09-12 ENCOUNTER — Other Ambulatory Visit: Payer: Self-pay

## 2022-09-12 VITALS — BP 141/84 | HR 72 | Temp 98.0°F | Resp 16 | Wt 202.0 lb

## 2022-09-12 DIAGNOSIS — E119 Type 2 diabetes mellitus without complications: Secondary | ICD-10-CM

## 2022-09-12 DIAGNOSIS — Z114 Encounter for screening for human immunodeficiency virus [HIV]: Secondary | ICD-10-CM

## 2022-09-12 DIAGNOSIS — Z1231 Encounter for screening mammogram for malignant neoplasm of breast: Secondary | ICD-10-CM

## 2022-09-12 DIAGNOSIS — I10 Essential (primary) hypertension: Secondary | ICD-10-CM

## 2022-09-12 DIAGNOSIS — E6609 Other obesity due to excess calories: Secondary | ICD-10-CM

## 2022-09-12 DIAGNOSIS — Z9189 Other specified personal risk factors, not elsewhere classified: Secondary | ICD-10-CM

## 2022-09-12 DIAGNOSIS — Z1211 Encounter for screening for malignant neoplasm of colon: Secondary | ICD-10-CM

## 2022-09-12 DIAGNOSIS — Z6835 Body mass index (BMI) 35.0-35.9, adult: Secondary | ICD-10-CM

## 2022-09-12 DIAGNOSIS — Z1159 Encounter for screening for other viral diseases: Secondary | ICD-10-CM

## 2022-09-12 LAB — POCT GLYCOSYLATED HEMOGLOBIN (HGB A1C): HbA1c, POC (controlled diabetic range): 7.1 % — AB (ref 0.0–7.0)

## 2022-09-12 MED ORDER — METFORMIN HCL ER 500 MG PO TB24
500.0000 mg | ORAL_TABLET | Freq: Two times a day (BID) | ORAL | 1 refills | Status: AC
  Filled 2022-09-12 – 2022-11-22 (×2): qty 180, 90d supply, fill #0

## 2022-09-12 MED ORDER — LISINOPRIL 10 MG PO TABS
10.0000 mg | ORAL_TABLET | Freq: Every day | ORAL | 3 refills | Status: DC
  Filled 2022-09-12: qty 90, 90d supply, fill #0

## 2022-09-12 NOTE — Progress Notes (Signed)
Subjective:  Patient ID: Tricia Carney, female    DOB: 03/18/1971  Age: 51 y.o. MRN: [id number]  CC: Diabetes   HPI Tricia Carney is a 51 year old Hispanic obese female. (Interpreter Tricia Carney [phone] ) presents for follow-up of diabetes. Patient does check blood sugar at home Compliant with meds - Yes (per patient labs show different)  Checking CBGs?   Fasting avg - 110-138   Postprandial average -  Exercising regularly? - No Watching carbohydrate intake? -yes Neuropathy ? - No Hypoglycemic events - No  - Recovers with :   Pertinent ROS:  Polyuria - No Polydipsia - No Vision problems - No  Bp elevated -Patient has No headache, No chest pain, No abdominal pain - No Nausea, No new weakness tingling or numbness, No Cough - shortness of breath  Medications as noted below. Taking them regularly without complication/adverse reaction being reported today.   History Tricia Carney has no past medical history on file.   She has no past surgical history on file.   Her family history includes Hypertension in her mother.She reports that she has never smoked. She has never used smokeless tobacco. She reports that she does not drink alcohol and does not use drugs.  Current Outpatient Medications on File Prior to Visit  Medication Sig Dispense Refill   pravastatin (PRAVACHOL) 20 MG tablet Take 1 tablet (20 mg total) by mouth daily. 90 tablet 1   metFORMIN (GLUCOPHAGE-XR) 500 MG 24 hr tablet Take 1 tablet (500 mg total) by mouth daily with breakfast. 90 tablet 1   No current facility-administered medications on file prior to visit.    ROS Comprehensive ROS Pertinent positive and negative noted in HPI    Objective:  Blood Pressure (Abnormal) 141/84 (BP Location: Left Arm, Patient Position: Sitting, Cuff Size: Large)   Pulse 72   Temperature 98 F (36.7 C)   Respiration 16   Weight 202 lb (91.6 kg)   Oxygen Saturation 98%   Body Mass Index 36.95 kg/m   BP Readings  from Last 3 Encounters:  09/12/22 (Abnormal) 141/84  06/08/22 131/84  02/09/22 125/64    Wt Readings from Last 3 Encounters:  09/12/22 202 lb (91.6 kg)  06/08/22 186 lb 6.4 oz (84.6 kg)   Physical exam: General: Vital signs reviewed.  Patient is well-developed and well-nourished, obese female in no acute distress and cooperative with exam. Head: Normocephalic and atraumatic. Eyes: EOMI, conjunctivae normal, no scleral icterus. Neck: Supple, trachea midline, normal ROM, no JVD, masses, thyromegaly, or carotid bruit present. Cardiovascular: RRR, S1 normal, S2 normal, no murmurs, gallops, or rubs. Pulmonary/Chest: Clear to auscultation bilaterally, no wheezes, rales, or rhonchi. Abdominal: Soft, non-tender, non-distended, BS +, no masses, organomegaly, or guarding present. Musculoskeletal: No joint deformities, erythema, or stiffness, ROM full and nontender. Extremities: No lower extremity edema bilaterally,  pulses symmetric and intact bilaterally. No cyanosis or clubbing. Neurological: A&O x3, Strength is normal Skin: Warm, dry and intact. No rashes or erythema. Psychiatric: Normal mood and affect. speech and behavior is normal. Cognition and memory are normal.    Lab Results  Component Value Date   HGBA1C 7.1 (A) 09/12/2022   HGBA1C 6.6 (A) 06/08/2022   HGBA1C 11.0 (H) 02/09/2022    Lab Results  Component Value Date   GLUCOSE 270 (H) 02/09/2022   CHOL 161 06/08/2022   TRIG 97 06/08/2022   HDL 39 (L) 06/08/2022   LDLCALC 104 (H) 06/08/2022   NA 135 02/09/2022   K 3.5 02/09/2022  CL 101 02/09/2022   CREATININE 0.52 02/09/2022   BUN 13 02/09/2022   CO2 23 02/09/2022   HGBA1C 7.1 (A) 09/12/2022     Assessment & Plan:     Tricia Carney was seen today for diabetes.  Diagnoses and all orders for this visit:  Type 2 diabetes mellitus without complication, without long-term current use of insulin (HCC) previous A1c was 6.6 has increased to 7.1 ADA recommends the following  therapeutic goals for glycemic control related to A1c measurements: Goal of therapy: Less than 6.5 hemoglobin A1c.  Reference clinical practice recommendations. Foods that are high in carbohydrates are the following rice, potatoes, breads, sugars, and pastas.  Reduction in the intake (eating) will assist in lowering your blood sugars. -Increased metformin from once daily to-     metFORMIN (GLUCOPHAGE-XR) 500 MG 24 hr tablet; Take 1 tablet (500 mg total) by mouth 2 (two) times daily with a meal.      Microalbumin / creatinine urine ratio  Essential hypertension BP goal - < 130/80 Explained that having normal blood pressure is the goal and medications are helping to get to goal and maintain normal blood pressure. DIET: Limit salt intake, read nutrition labels to check salt content, limit fried and high fatty foods  Avoid using multisymptom OTC cold preparations that generally contain sudafed which can rise BP. Consult with pharmacist on best cold relief products to use for persons with HTN EXERCISE Discussed incorporating exercise such as walking - 30 minutes most days of the week and can do in 10 minute intervals    Increase lisinopril from 5 mg for renal protection to 10 mg for blood pressure control -     lisinopril (ZESTRIL) 10 MG tablet; Take 1 tablet (10 mg total) by mouth daily.  Encounter for HCV screening test for high risk patient -     HCV Ab w Reflex to Quant PCR  Encounter for screening for HIV -     HIV Antibody (routine testing w rflx)  Colon cancer screening -     Fecal occult blood, imunochemical; Future  Encounter for screening mammogram for malignant neoplasm of breast -     MM Digital Screening; Future  Class 2 obesity due to excess calories without serious comorbidity with body mass index (BMI) of 35.0 to 35.9 in adult Obesity is 30-39 indicating an excess in caloric intake or underlining conditions. This may lead to other co-morbidities. Educated on lifestyle  modifications of diet and exercise which may reduce obesity.       I have discontinued Tricia Carney's lisinopril. I am also having her maintain her metFORMIN and pravastatin.  No orders of the defined types were placed in this encounter.    Follow-up:   Return for Pap .  The above assessment and management plan was discussed with the patient. The patient verbalized understanding of and has agreed to the management plan. Patient is aware to call the clinic if symptoms fail to improve or worsen. Patient is aware when to return to the clinic for a follow-up visit. Patient educated on when it is appropriate to go to the emergency department.   Juluis Mire, NP-C

## 2022-09-12 NOTE — Progress Notes (Signed)
F/u DM Not taking all medications

## 2022-09-12 NOTE — Patient Instructions (Signed)
Recuento de caloras para bajar de peso Calorie Counting for Weight Loss Las caloras son unidades de energa. El cuerpo necesita una cierta cantidad de caloras de los alimentos para que lo ayuden a funcionar durante todo el da. Cuando se comen o beben ms caloras de las que el cuerpo necesita, este acumula las caloras adicionales mayormente como grasa. Cuando se comen o beben menos caloras de las que el cuerpo necesita, este quema grasa para obtener la energa que necesita. El recuento de caloras es el registro de la cantidad de caloras que se comen y beben cada da. El recuento de caloras puede ser de ayuda si necesita perder peso. Si come menos caloras de las que el cuerpo necesita, debera bajar de peso. Pregntele al mdico cul es un peso sano para usted. Para que el recuento de caloras funcione, usted tendr que ingerir la cantidad de caloras adecuadas cada da, para bajar una cantidad de peso saludable por semana. Un nutricionista puede ayudar a determinar la cantidad de caloras que usted necesita por da y sugerirle formas de alcanzar su objetivo calrico. Una cantidad de peso saludable para bajar cada semana suele ser entre 1 y 2 libras (0.5 a 0.9 kg). Esto habitualmente significa que su ingesta diaria de caloras se debera reducir en unas 500 a 750 caloras. Ingerir de 1200 a 1500 caloras por da puede ayudar a la mayora de las mujeres a bajar de peso. Ingerir de 1500 a 1800 caloras por da puede ayudar a la mayora de los hombres a bajar de peso. Qu debo saber acerca del recuento de caloras? Trabaje con el mdico o el nutricionista para determinar cuntas caloras debe recibir cada da. A fin de alcanzar su objetivo diario de caloras, tendr que: Averiguar cuntas caloras hay en cada alimento que le gustara comer. Intente hacerlo antes de comer. Decidir la cantidad que puede comer del alimento. Llevar un registro de los alimentos. Para esto, anote lo que comi y cuntas  caloras tena. Para perder peso con xito, es importante equilibrar el recuento de caloras con un estilo de vida saludable que incluya actividad fsica de forma regular. Dnde encuentro informacin sobre las caloras?  Es posible encontrar la cantidad de caloras que contiene un alimento en la etiqueta de informacin nutricional. Si un alimento no tiene una etiqueta de informacin nutricional, intente buscar las caloras en Internet o pida ayuda al nutricionista. Recuerde que las caloras se calculan por porcin. Si opta por comer ms de una porcin de un alimento, tendr que multiplicar las caloras de una porcin por la cantidad de porciones que planea comer. Por ejemplo, la etiqueta de un envase de pan puede decir que el tamao de una porcin es 1 rodaja, y que una porcin tiene 90 caloras. Si come 1 rodaja, habr comido 90 caloras. Si come 2 rodajas, habr comido 180 caloras. Cmo llevo un registro de comidas? Despus de cada vez que coma, anote lo siguiente en el registro de alimentos lo antes posible: Lo que comi. Asegrese de incluir los aderezos, las salsas y otros extras en los alimentos. La cantidad que comi. Esto se puede medir en tazas, onzas o cantidad de alimentos. Cuntas caloras haba en cada alimento y en cada bebida. La cantidad total de caloras en la comida que tom. Tenga a mano el registro de alimentos, por ejemplo, en un anotador de bolsillo o utilice una aplicacin o sitio web en el telfono mvil. Algunos programas calcularn las caloras por usted y le mostrarn la cantidad de   caloras que le quedan para llegar al objetivo diario. Cules son algunos consejos para controlar las porciones? Sepa cuntas caloras hay en una porcin. Esto lo ayudar a saber cuntas porciones de un alimento determinado puede comer. Use una taza medidora para medir los tamaos de las porciones. Tambin puede intentar pesar las porciones en una balanza de cocina. Con el tiempo, podr hacer  un clculo estimativo de los tamaos de las porciones de algunos alimentos. Dedique tiempo a poner porciones de diferentes alimentos en sus platos, tazones y tazas predilectos, a fin de saber cmo se ve una porcin. Intente no comer directamente de un envase de alimentos, por ejemplo, de una bolsa o una caja. Comer directamente del envase dificulta ver cunto est comiendo y puede conducir a comer en exceso. Ponga la cantidad que le gustara comer en una taza o un plato, a fin de asegurarse de que est comiendo la porcin correcta. Use platos, vasos y tazones ms pequeos para medir porciones ms pequeas y evitar no comer en exceso. Intente no realizar varias tareas al mismo tiempo. Por ejemplo, evite mirar televisin o usar la computadora mientras come. Si es la hora de comer, sintese a la mesa y disfrute de la comida. Esto lo ayudar a reconocer cundo est satisfecho. Tambin le permitir estar ms consciente de qu come y cunto come. Consejos para seguir este plan Al leer las etiquetas de los alimentos Controle el recuento de caloras en comparacin con el tamao de la porcin. El tamao de la porcin puede ser ms pequeo de lo que suele comer. Verifique la fuente de las caloras. Intente elegir alimentos ricos en protenas, fibras y vitaminas, y bajos en grasas saturadas, grasas trans y sodio. Al ir de compras Lea las etiquetas nutricionales cuando compre. Esto lo ayudar a tomar decisiones saludables sobre qu alimentos comprar. Preste atencin a las etiquetas nutricionales de alimentos bajos en grasas o sin grasas. Estos alimentos a veces tienen la misma cantidad de caloras o ms caloras que las versiones ricas en grasas. Con frecuencia, tambin tienen agregados de azcar, almidn o sal, para darles el sabor que fue eliminado con las grasas. Haga una lista de compras con los alimentos que tienen un menor contenido de caloras y resptela. Al cocinar Intente cocinar sus alimentos preferidos  de una manera ms saludable. Por ejemplo, pruebe hornear en vez de frer. Utilice productos lcteos descremados. Planificacin de las comidas Utilice ms frutas y verduras. La mitad de su plato debe ser de frutas y verduras. Incluya protenas magras, como pollo, pavo y pescado. Estilo de vida Cada semana, trate de hacer una de las siguientes cosas: 150 minutos de ejercicio moderado, como caminar. 75 minutos de ejercicio enrgico, como correr. Informacin general Sepa cuntas caloras tienen los alimentos que come con ms frecuencia. Esto le ayudar a contar las caloras ms rpidamente. Encuentre un mtodo para controlar las caloras que funcione para usted. Sea creativo. Pruebe aplicaciones o programas distintos, si llevar un registro de las caloras no funciona para usted. Qu alimentos debo consumir?  Consuma alimentos nutritivos. Es mejor comer un alimento nutritivo, de alto contenido calrico, como un aguacate, que uno con pocos nutrientes, como una bolsa de patatas fritas. Use sus caloras en alimentos y bebidas que lo sacien y no lo dejen con apetito apenas termina de comer. Ejemplos de alimentos que lo sacian son los frutos secos y mantequillas de frutos secos, verduras, protenas magras y alimentos con alto contenido de fibra como los cereales integrales. Los alimentos con alto   contenido de fibra son aquellos que tienen ms de 5 g de fibra por porcin. Preste atencin a las caloras en las bebidas. Las bebidas de bajas caloras incluyen agua y refrescos sin azcar. Es posible que los productos que se enumeran ms arriba no constituyan una lista completa de los alimentos y las bebidas que puede tomar. Consulte a un nutricionista para obtener ms informacin. Qu alimentos debo limitar? Limite el consumo de alimentos o bebidas que no sean buenas fuentes de vitaminas, minerales o protenas, o que tengan alto contenido de grasas no saludables. Estos incluyen: Caramelos. Otros  dulces. Refrescos, bebidas con caf especiales, alcohol y jugo. Es posible que los productos que se enumeran ms arriba no constituyan una lista completa de los alimentos y las bebidas que debe evitar. Consulte a un nutricionista para obtener ms informacin. Cmo puedo hacer el recuento de caloras cuando como afuera? Preste atencin a las porciones. A menudo, las porciones son mucho ms grandes al comer afuera. Pruebe con estos consejos para mantener las porciones ms pequeas: Considere la posibilidad de compartir una comida en lugar de tomarla toda usted solo. Si pide su propia comida, coma solo la mitad. Antes de empezar a comer, pida un recipiente y ponga la mitad de la comida en l. Cuando sea posible, considere la posibilidad de pedir porciones ms pequeas del men en lugar de porciones completas. Preste atencin a la eleccin de alimentos y bebidas. Saber la forma en que se cocinan los alimentos y lo que incluye la comida puede ayudarlo a ingerir menos caloras. Si se detallan las caloras en el men, elija las opciones que contengan la menor cantidad. Elija platos que incluyan verduras, frutas, cereales integrales, productos lcteos con bajo contenido de grasa y protenas magras. Opte por los alimentos hervidos, asados, cocidos a la parrilla o al vapor. Evite los alimentos a los que se les ponga mantequilla, que estn empanados o fritos, o que se sirvan con salsa a base de crema. Generalmente, los alimentos que se etiquetan como "crujientes" estn fritos, a menos que se indique lo contrario. Elija el agua, la leche descremada, el t helado sin azcar u otras bebidas que no contengan azcares agregados. Si desea una bebida alcohlica, escoja una opcin con menos caloras, como una copa de vino o una cerveza ligera. Ordene los aderezos, las salsas y los jarabes aparte. Estos son, con frecuencia, de alto contenido en caloras, por lo que debe limitar la cantidad que ingiere. Si desea una  ensalada, elija una de hortalizas y pida carnes a la parrilla. Evite las guarniciones adicionales como el tocino, el queso o los alimentos fritos. Ordene el aderezo aparte o pida aceite de oliva y vinagre o limn para aderezar. Haga un clculo estimativo de la cantidad de porciones que le sirven. Conocer el tamao de las porciones lo ayudar a estar atento a la cantidad de comida que come en los restaurantes. Dnde buscar ms informacin Centers for Disease Control and Prevention (Centros para el Control y la Prevencin de Enfermedades): www.cdc.gov U.S. Department of Agriculture (Departamento de Agricultura de los [place]. [place]): myplate.gov Resumen El recuento de caloras es el registro de la cantidad de caloras que se comen y beben cada da. Si come menos caloras de las que el cuerpo necesita, debera bajar de peso. Una cantidad de peso saludable para bajar por semana suele ser entre 1 y 2 libras (0.5 a 0.9 kg). Esto significa, con frecuencia, reducir su ingesta diaria de caloras unas 500 a 750 caloras. Es posible   encontrar la cantidad de caloras que contiene un alimento en la etiqueta de informacin nutricional. Si un alimento no tiene una etiqueta de informacin nutricional, intente buscar las caloras en Internet o pida ayuda al nutricionista. Use platos, vasos y tazones ms pequeos para medir porciones ms pequeas y evitar no comer en exceso. Use sus caloras en alimentos y bebidas que lo sacien y no lo dejen con apetito poco tiempo despus de haber comido. Esta informacin no tiene como fin reemplazar el consejo del mdico. Asegrese de hacerle al mdico cualquier pregunta que tenga. Document Revised: 01/27/2020 Document Reviewed: 01/27/2020 Elsevier Patient Education  2023 Elsevier Inc.  

## 2022-09-13 LAB — MICROALBUMIN / CREATININE URINE RATIO
Creatinine, Urine: 80.4 mg/dL
Microalb/Creat Ratio: 9 mg/g creat (ref 0–29)
Microalbumin, Urine: 7.6 ug/mL

## 2022-09-13 LAB — HIV ANTIBODY (ROUTINE TESTING W REFLEX): HIV Screen 4th Generation wRfx: NONREACTIVE

## 2022-09-13 LAB — HCV INTERPRETATION

## 2022-09-13 LAB — HCV AB W REFLEX TO QUANT PCR: HCV Ab: NONREACTIVE

## 2022-09-19 ENCOUNTER — Other Ambulatory Visit: Payer: Self-pay

## 2022-10-28 ENCOUNTER — Ambulatory Visit (INDEPENDENT_AMBULATORY_CARE_PROVIDER_SITE_OTHER): Payer: Self-pay | Admitting: Primary Care

## 2022-10-28 ENCOUNTER — Other Ambulatory Visit: Payer: Self-pay

## 2022-10-28 ENCOUNTER — Other Ambulatory Visit (HOSPITAL_COMMUNITY)
Admission: RE | Admit: 2022-10-28 | Discharge: 2022-10-28 | Disposition: A | Discharge status: Home / Self Care | Payer: Self-pay | Source: Ambulatory Visit | Attending: Primary Care | Admitting: Primary Care

## 2022-10-28 ENCOUNTER — Encounter (INDEPENDENT_AMBULATORY_CARE_PROVIDER_SITE_OTHER): Payer: Self-pay | Admitting: Primary Care

## 2022-10-28 VITALS — BP 149/92 | HR 80 | Resp 16 | Ht 62.0 in | Wt 195.0 lb

## 2022-10-28 DIAGNOSIS — N898 Other specified noninflammatory disorders of vagina: Secondary | ICD-10-CM

## 2022-10-28 DIAGNOSIS — Z1231 Encounter for screening mammogram for malignant neoplasm of breast: Secondary | ICD-10-CM

## 2022-10-28 DIAGNOSIS — I10 Essential (primary) hypertension: Secondary | ICD-10-CM

## 2022-10-28 DIAGNOSIS — Z124 Encounter for screening for malignant neoplasm of cervix: Secondary | ICD-10-CM

## 2022-10-28 MED ORDER — AMLODIPINE BESYLATE 10 MG PO TABS
10.0000 mg | ORAL_TABLET | Freq: Every day | ORAL | 1 refills | Status: AC
  Filled 2022-10-28 – 2022-11-22 (×2): qty 90, 90d supply, fill #0

## 2022-10-28 MED ORDER — LISINOPRIL 20 MG PO TABS
20.0000 mg | ORAL_TABLET | Freq: Every day | ORAL | 3 refills | Status: AC
  Filled 2022-10-28 – 2022-11-22 (×2): qty 90, 90d supply, fill #0

## 2022-10-28 NOTE — Progress Notes (Signed)
[facility] Patient name: Tricia Carney MRN [id number]  Date of birth: 22-Mar-1971 Chief Complaint:   Gynecologic Exam  History of Present Illness:   Tricia Carney is a 52 y.o. No obstetric history on file. female being seen today for a routine well-woman exam.   CC:gyn  Ivin Booty [id number]  The current method of family planning is none.  No LMP recorded. Patient is postmenopausal. Last pap none. Last mammogram: none. Results were: none. Family h/o breast cancer: No Last colonoscopy: none. . Family h/o colorectal cancer: Yes  Review of Systems:    Denies any headaches, blurred vision, fatigue, shortness of breath, chest pain, abdominal pain, abnormal vaginal discharge/itching/odor/irritation, problems with periods, bowel movements, urination, or intercourse unless otherwise stated above.  Pertinent History Reviewed:   Reviewed past medical,surgical, social and family history.  Reviewed problem list, medications and allergies.  Physical Assessment:   Vitals:   10/28/22 1032 10/28/22 1130  BP: (Abnormal) 148/91 (Abnormal) 149/92  Pulse: 80   Resp: 16   SpO2: 97%   Weight: 195 lb (88.5 kg)   Height: 5\' 2"  (1.575 m)   Body mass index is 35.67 kg/m.        Physical Examination:  General appearance - well appearing, and in no distress Mental status - alert, oriented to person, place, and time Psych:  She has a normal mood and affect Skin - warm and dry, normal color, no suspicious lesions noted Chest - effort normal, all lung fields clear to auscultation bilaterally Heart - normal rate and regular rhythm Neck:  midline trachea, no thyromegaly or nodules Breasts - breasts appear normal, no suspicious masses, no skin or nipple changes or axillary nodes Educated patient on proper self breast examination and had patient to demonstrate SBE. Abdomen - soft, nontender, nondistended, no masses or organomegaly Pelvic-VULVA:  normal appearing vulva with no masses, tenderness or lesions   VAGINA: normal appearing vagina with normal color and discharge, no lesions   CERVIX: normal appearing cervix without discharge or lesions, no CMT UTERUS: uterus is felt to be normal size, shape, consistency and nontender  ADNEXA: No adnexal masses or tenderness noted. Extremities:  No swelling or varicosities noted  No results found for this or any previous visit (from the past 24 hour(s)).   Assessment & Plan:  Tricia Carney was seen today for gynecologic exam.  Diagnoses and all orders for this visit:  Cervical cancer screening -     Cytology - PAP  Vaginal itching -     Cervicovaginal ancillary only  Encounter for screening mammogram for malignant neoplasm of breast Patient completed application for [facility] while in clinic and application has and fax[facility] to [facility]. Patient aware that [facility] will contact her directly to schedule appointment.   -  Essential hypertension BP goal - < 130/80 Explained that having normal blood pressure is the goal and medications are helping to get to goal and maintain normal blood pressure. DIET: Limit salt intake, read nutrition labels to check salt content, limit fried and high fatty foods  Avoid using multisymptom OTC cold preparations that generally contain sudafed which can rise BP. Consult with pharmacist on best cold relief products to use for persons with HTN EXERCISE Discussed incorporating exercise such as walking - 30 minutes most days of the week and can do in 10 minute intervals          lisinopril (ZESTRIL) 20 MG tablet; Take 1 tablet (20 mg total) by mouth daily. -  amLODipine (NORVASC) 10 MG tablet; Take 1 tablet (10 mg total) by mouth daily.  Follow-up: Return in about 4 weeks (around 11/25/2022) for Bp ck.  This note has been created with Surveyor, quantity. Any transcriptional errors are unintentional.   Kerin Perna,  NP 10/28/2022, 12:12 PM

## 2022-11-01 LAB — CERVICOVAGINAL ANCILLARY ONLY
Bacterial Vaginitis (gardnerella): NEGATIVE
Candida Glabrata: NEGATIVE
Candida Vaginitis: NEGATIVE
Chlamydia: NEGATIVE
Comment: NEGATIVE
Comment: NEGATIVE
Comment: NEGATIVE
Comment: NEGATIVE
Comment: NEGATIVE
Comment: NORMAL
Neisseria Gonorrhea: NEGATIVE
Trichomonas: NEGATIVE

## 2022-11-02 ENCOUNTER — Telehealth (INDEPENDENT_AMBULATORY_CARE_PROVIDER_SITE_OTHER): Payer: Self-pay

## 2022-11-02 NOTE — Telephone Encounter (Signed)
[employer] Terrial Rhodes  Id# [id number]  contacted pt to go over lab results pt is aware and doesn't have any questions or concerns

## 2022-11-03 ENCOUNTER — Other Ambulatory Visit: Payer: Self-pay

## 2022-11-03 LAB — CYTOLOGY - PAP: Diagnosis: NEGATIVE

## 2022-11-22 ENCOUNTER — Other Ambulatory Visit: Payer: Self-pay

## 2022-12-15 ENCOUNTER — Ambulatory Visit
Admission: RE | Admit: 2022-12-15 | Discharge: 2022-12-15 | Disposition: A | Discharge status: Home / Self Care | Payer: No Typology Code available for payment source | Source: Ambulatory Visit | Attending: Primary Care | Admitting: Primary Care

## 2022-12-15 DIAGNOSIS — Z1231 Encounter for screening mammogram for malignant neoplasm of breast: Secondary | ICD-10-CM

## 2022-12-16 ENCOUNTER — Ambulatory Visit (INDEPENDENT_AMBULATORY_CARE_PROVIDER_SITE_OTHER): Payer: Self-pay | Admitting: Primary Care
